# Patient Record
Sex: Male | Born: 1990
Health system: Southern US, Community
[De-identification: ages and names within clinical notes are randomized; demographics above are authoritative.]

## PROBLEM LIST (undated history)

## (undated) ENCOUNTER — Ambulatory Visit: Admission: EM | Discharge status: Against Medical Advice | Payer: Self-pay

## (undated) DIAGNOSIS — J302 Other seasonal allergic rhinitis: Secondary | ICD-10-CM

## (undated) DIAGNOSIS — M419 Scoliosis, unspecified: Secondary | ICD-10-CM

## (undated) DIAGNOSIS — R55 Syncope and collapse: Secondary | ICD-10-CM

## (undated) DIAGNOSIS — K219 Gastro-esophageal reflux disease without esophagitis: Secondary | ICD-10-CM

## (undated) DIAGNOSIS — N201 Calculus of ureter: Secondary | ICD-10-CM

## (undated) DIAGNOSIS — N200 Calculus of kidney: Secondary | ICD-10-CM

## (undated) DIAGNOSIS — T8859XA Other complications of anesthesia, initial encounter: Secondary | ICD-10-CM

## (undated) HISTORY — PX: BACK SURGERY: SHX140

## (undated) HISTORY — PX: TONSILLECTOMY: SUR1361

---
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
## 2010-02-06 ENCOUNTER — Emergency Department: Payer: Self-pay | Admitting: Emergency Medicine

## 2012-06-06 ENCOUNTER — Ambulatory Visit: Payer: Self-pay

## 2012-06-06 LAB — CBC WITH DIFFERENTIAL/PLATELET
Basophil #: 0.1 10*3/uL (ref 0.0–0.1)
Basophil %: 1.1 %
Eosinophil #: 0.4 10*3/uL (ref 0.0–0.7)
Eosinophil %: 4.2 %
Lymphocyte #: 2.4 10*3/uL (ref 1.0–3.6)
Lymphocyte %: 27.1 %
MCH: 29.5 pg (ref 26.0–34.0)
MCHC: 34.2 g/dL (ref 32.0–36.0)
MCV: 86 fL (ref 80–100)
Monocyte #: 0.8 x10 3/mm (ref 0.2–1.0)
Neutrophil %: 58.8 %
Platelet: 230 10*3/uL (ref 150–440)
RDW: 12.4 % (ref 11.5–14.5)
WBC: 8.7 10*3/uL (ref 3.8–10.6)

## 2013-05-27 ENCOUNTER — Emergency Department: Payer: Self-pay | Admitting: Emergency Medicine

## 2013-05-30 ENCOUNTER — Ambulatory Visit: Payer: Self-pay | Admitting: Medical

## 2016-05-19 ENCOUNTER — Ambulatory Visit
Admission: EM | Admit: 2016-05-19 | Discharge: 2016-05-19 | Disposition: A | Discharge status: Home / Self Care | Payer: BLUE CROSS/BLUE SHIELD | Attending: Family Medicine | Admitting: Family Medicine

## 2016-05-19 ENCOUNTER — Encounter: Payer: Self-pay | Admitting: *Deleted

## 2016-05-19 DIAGNOSIS — L089 Local infection of the skin and subcutaneous tissue, unspecified: Secondary | ICD-10-CM

## 2016-05-19 HISTORY — DX: Scoliosis, unspecified: M41.9

## 2016-05-19 MED ORDER — DOXYCYCLINE HYCLATE 100 MG PO CAPS: 100.0000 mg | ORAL_CAPSULE | Freq: Two times a day (BID) | ORAL | 0 refills | Status: AC

## 2016-05-19 MED ORDER — IBUPROFEN 600 MG PO TABS: 600.0000 mg | ORAL_TABLET | Freq: Four times a day (QID) | ORAL | 0 refills | Status: DC | PRN
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
## 2016-05-19 NOTE — ED Triage Notes (Signed)
Patient noticed 2 insect bites 2 days ago located on his right buttock and left side a waist line. Patient reports removing a crawling tick from his left flank.

## 2016-05-19 NOTE — Discharge Instructions (Signed)
Initially antibiotics even if he starts to feel better also try Claritin or Allegra, or Zyrtec to help with the itching and swelling, cool compresses, ibuprofen 600 mg with 1 g of Tylenol 3-4 times a day as needed for pain and bacitracin Topical ointment.

## 2016-06-05 ENCOUNTER — Encounter: Payer: Self-pay | Admitting: Physician Assistant

## 2016-06-05 ENCOUNTER — Emergency Department
Admission: EM | Admit: 2016-06-05 | Discharge: 2016-06-05 | Disposition: A | Discharge status: Home / Self Care | Payer: BLUE CROSS/BLUE SHIELD | Attending: Emergency Medicine | Admitting: Emergency Medicine

## 2016-06-05 DIAGNOSIS — R112 Nausea with vomiting, unspecified: Secondary | ICD-10-CM | POA: Diagnosis present

## 2016-06-05 DIAGNOSIS — K529 Noninfective gastroenteritis and colitis, unspecified: Secondary | ICD-10-CM | POA: Insufficient documentation

## 2016-06-05 MED ORDER — ONDANSETRON 4 MG PO TBDP: 4.0000 mg | ORAL_TABLET | Freq: Four times a day (QID) | ORAL | 0 refills | Status: DC | PRN

## 2016-06-05 MED ORDER — ONDANSETRON 4 MG PO TBDP
4.0000 mg | ORAL_TABLET | Freq: Once | ORAL | Status: AC
  Administered 2016-06-05: 4 mg via ORAL
  Filled 2016-06-05: qty 1

## 2016-06-05 MED ORDER — LOPERAMIDE HCL 2 MG PO CAPS
2.0000 mg | ORAL_CAPSULE | Freq: Once | ORAL | Status: AC
  Administered 2016-06-05: 2 mg via ORAL
  Filled 2016-06-05: qty 1

## 2016-06-05 NOTE — Discharge Instructions (Signed)
Your symptoms appear to be due to a viral infection. Take the nausea medicine as needed. Increase intake of liquids like Gatorade or Powerade. See your provider for continued symptoms. Return as needed.

## 2016-06-05 NOTE — ED Triage Notes (Signed)
Nausea vomiting and diarrhea since 5 am. Denies abd pain. Wife with same symptoms.

## 2016-06-07 ENCOUNTER — Encounter: Payer: Self-pay | Admitting: Physician Assistant
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
## 2017-06-08 ENCOUNTER — Ambulatory Visit
Admission: EM | Admit: 2017-06-08 | Discharge: 2017-06-08 | Disposition: A | Discharge status: Home / Self Care | Payer: BLUE CROSS/BLUE SHIELD | Attending: Family Medicine | Admitting: Family Medicine

## 2017-06-08 ENCOUNTER — Other Ambulatory Visit: Payer: Self-pay

## 2017-06-08 ENCOUNTER — Encounter: Payer: Self-pay | Admitting: Emergency Medicine

## 2017-06-08 DIAGNOSIS — J301 Allergic rhinitis due to pollen: Secondary | ICD-10-CM

## 2017-06-08 HISTORY — DX: Syncope and collapse: R55

## 2017-06-08 HISTORY — DX: Other seasonal allergic rhinitis: J30.2

## 2017-06-08 MED ORDER — FLUTICASONE PROPIONATE 50 MCG/ACT NA SUSP: 2.0000 | Freq: Every day | NASAL | 0 refills | Status: DC

## 2017-06-08 MED ORDER — CETIRIZINE-PSEUDOEPHEDRINE ER 5-120 MG PO TB12: 1.0000 | ORAL_TABLET | Freq: Two times a day (BID) | ORAL | 0 refills | Status: DC

## 2017-06-08 NOTE — Discharge Instructions (Signed)
Recommend using Nettie pot daily.  After using the Nettie pot spray with Flonase.  Use Zyrtec-D for 1 to 2 weeks and then continue with regular Zyrtec thereafter.

## 2017-06-08 NOTE — ED Triage Notes (Signed)
Patient in today c/o nasal congestion and drainage x 2 days. Patient denies fever. Patient taking OTC Zyrtec and Sudafed without relief.
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
## 2018-03-18 ENCOUNTER — Encounter: Payer: Self-pay | Admitting: Emergency Medicine

## 2018-03-18 ENCOUNTER — Emergency Department
Admission: EM | Admit: 2018-03-18 | Discharge: 2018-03-18 | Disposition: A | Discharge status: Home / Self Care | Payer: Self-pay | Attending: Student in an Organized Health Care Education/Training Program | Admitting: Student in an Organized Health Care Education/Training Program

## 2018-03-18 ENCOUNTER — Other Ambulatory Visit: Payer: Self-pay

## 2018-03-18 ENCOUNTER — Emergency Department: Payer: Self-pay

## 2018-03-18 DIAGNOSIS — R69 Illness, unspecified: Secondary | ICD-10-CM

## 2018-03-18 DIAGNOSIS — Z79899 Other long term (current) drug therapy: Secondary | ICD-10-CM | POA: Insufficient documentation

## 2018-03-18 DIAGNOSIS — J111 Influenza due to unidentified influenza virus with other respiratory manifestations: Secondary | ICD-10-CM | POA: Insufficient documentation

## 2018-03-18 MED ORDER — ACETAMINOPHEN 500 MG PO TABS: 500.0000 mg | ORAL_TABLET | Freq: Four times a day (QID) | ORAL | 0 refills | Status: DC | PRN

## 2018-03-18 MED ORDER — IBUPROFEN 600 MG PO TABS: 600.0000 mg | ORAL_TABLET | Freq: Four times a day (QID) | ORAL | 0 refills | Status: DC | PRN

## 2018-03-18 MED ORDER — ONDANSETRON 4 MG PO TBDP
4.0000 mg | ORAL_TABLET | Freq: Once | ORAL | Status: AC
  Administered 2018-03-18: 4 mg via ORAL
  Filled 2018-03-18: qty 1

## 2018-03-18 MED ORDER — IPRATROPIUM-ALBUTEROL 0.5-2.5 (3) MG/3ML IN SOLN
3.0000 mL | Freq: Once | RESPIRATORY_TRACT | Status: AC
  Administered 2018-03-18: 3 mL via RESPIRATORY_TRACT
  Filled 2018-03-18: qty 3

## 2018-03-18 MED ORDER — BENZONATATE 100 MG PO CAPS
100.0000 mg | ORAL_CAPSULE | Freq: Once | ORAL | Status: AC
  Administered 2018-03-18: 100 mg via ORAL
  Filled 2018-03-18: qty 1

## 2018-03-18 MED ORDER — GUAIFENESIN-CODEINE 100-10 MG/5ML PO SYRP: 5.0000 mL | ORAL_SOLUTION | Freq: Three times a day (TID) | ORAL | 0 refills | Status: AC | PRN

## 2018-03-18 MED ORDER — AZITHROMYCIN 250 MG PO TABS: ORAL_TABLET | ORAL | 0 refills | Status: DC

## 2018-03-18 NOTE — ED Triage Notes (Signed)
Cough, chest congestion x 2-3 days.  Reports fevers last week.    AAOx3.  Skin warm and dry. NAD
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
## 2018-03-18 NOTE — ED Notes (Signed)
See triage note  Presents with cough and low grade fever  States he was sick last week with body aches and subjective fever   States cough and fever returned this weekend

## 2018-03-18 NOTE — ED Notes (Signed)
First Nurse Note: Patient to ED with Mother, WC from car.  Complaining of coughing and congestion.  Given mask to wear.

## 2020-02-22 ENCOUNTER — Ambulatory Visit: Admit: 2020-02-22 | Discharge: 2020-02-22 | Discharge status: Absent without leave | Payer: Self-pay

## 2020-02-22 ENCOUNTER — Encounter: Payer: Self-pay | Admitting: Emergency Medicine

## 2020-02-22 ENCOUNTER — Ambulatory Visit
Admission: EM | Admit: 2020-02-22 | Discharge: 2020-02-22 | Disposition: A | Discharge status: Home / Self Care | Payer: HRSA Program | Attending: Physician Assistant | Admitting: Physician Assistant

## 2020-02-22 ENCOUNTER — Other Ambulatory Visit: Payer: Self-pay

## 2020-02-22 DIAGNOSIS — R059 Cough, unspecified: Secondary | ICD-10-CM | POA: Insufficient documentation

## 2020-02-22 DIAGNOSIS — U071 COVID-19: Secondary | ICD-10-CM | POA: Insufficient documentation

## 2020-02-22 DIAGNOSIS — R0981 Nasal congestion: Secondary | ICD-10-CM | POA: Insufficient documentation

## 2020-02-22 DIAGNOSIS — J069 Acute upper respiratory infection, unspecified: Secondary | ICD-10-CM | POA: Diagnosis not present

## 2020-02-22 NOTE — Discharge Instructions (Addendum)

## 2020-02-22 NOTE — ED Triage Notes (Signed)
Patient c/o cough and runny nose that started on Monday.  Patient denies fevers.
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
## 2020-02-23 ENCOUNTER — Encounter: Payer: Self-pay | Admitting: Emergency Medicine

## 2020-02-23 LAB — SARS CORONAVIRUS 2 (TAT 6-24 HRS): SARS Coronavirus 2: POSITIVE — AB

## 2020-03-26 IMAGING — CR DG CHEST 2V
1 series · 2 of 2 positions shown · non-contrast
Comparison: None.

CLINICAL DATA: Cough and fever

EXAM:
CHEST - 2 VIEW

[Series 1: dg chest 2 view · 0.14mm/px · 2 of 2 slices shown]
[im 1/2]
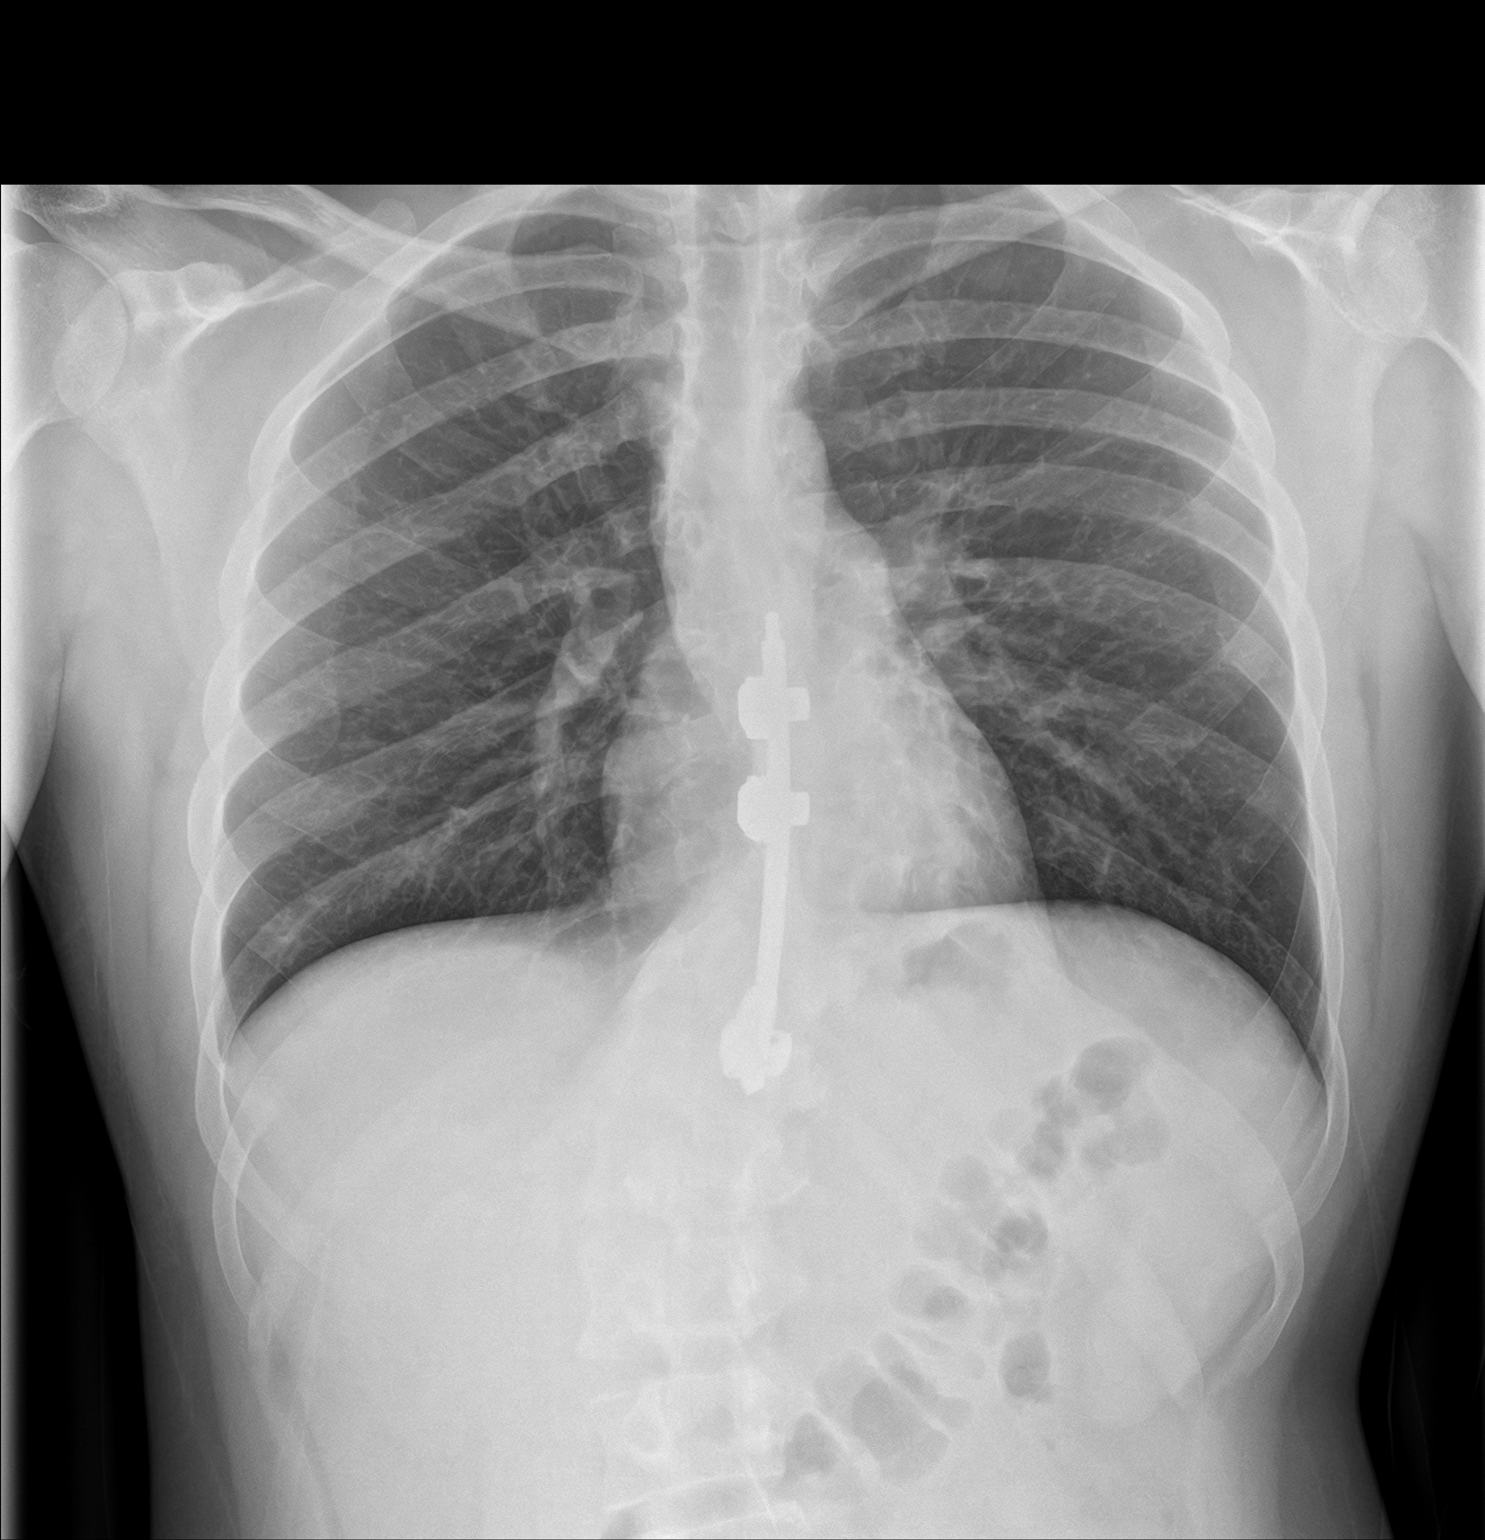
[im 2/2]
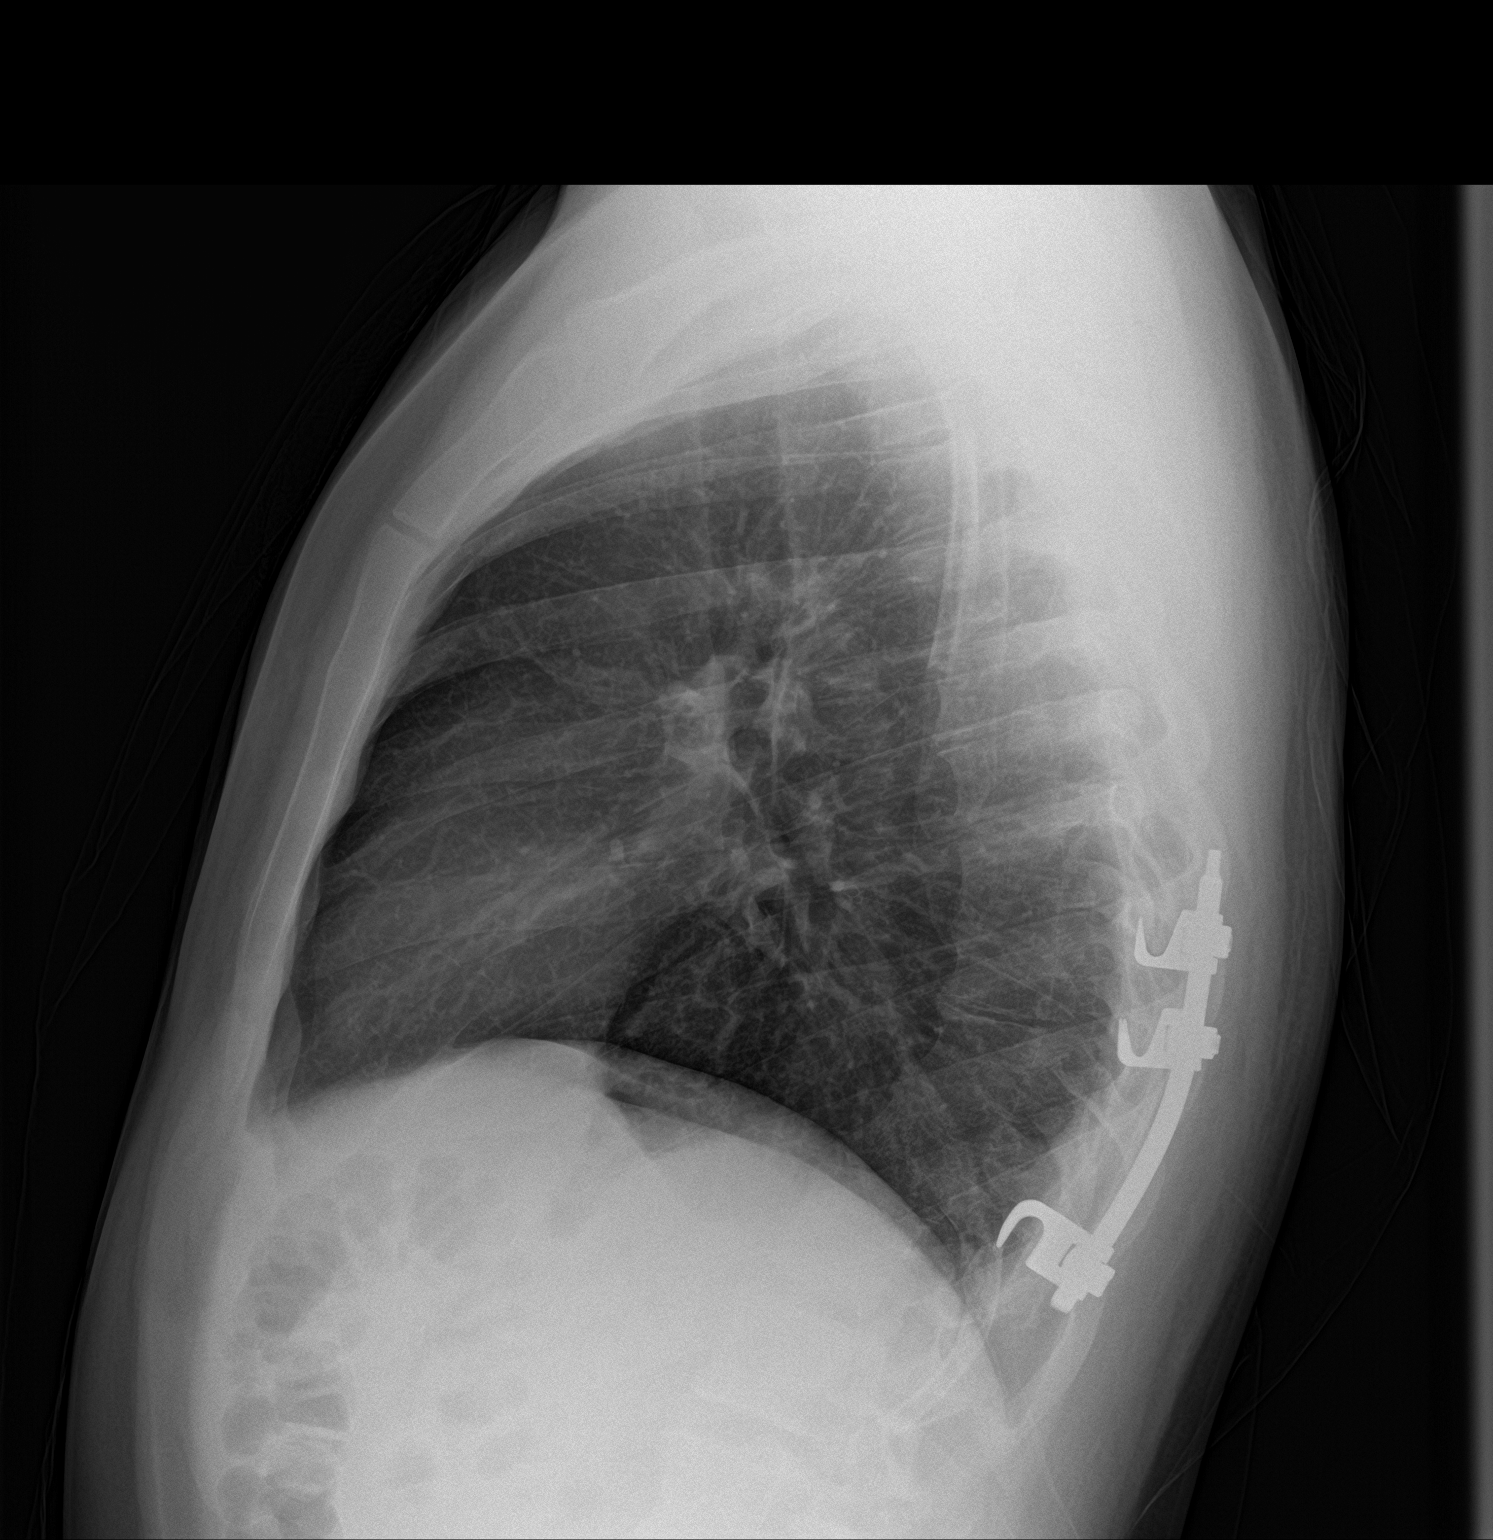

[2 of 2 positions shown; findings below may reference images not displayed]

FINDINGS: Lungs are clear. Heart size and pulmonary vascularity are normal. No
adenopathy. There is postoperative change in the lower thoracic
region with lower thoracic levoscoliosis.
IMPRESSION: No edema or consolidation.

## 2024-01-17 ENCOUNTER — Other Ambulatory Visit: Payer: Self-pay

## 2024-01-17 ENCOUNTER — Emergency Department
Admission: EM | Admit: 2024-01-17 | Discharge: 2024-01-17 | Disposition: A | Discharge status: Home / Self Care | Payer: Self-pay | Attending: Emergency Medicine | Admitting: Emergency Medicine

## 2024-01-17 ENCOUNTER — Encounter: Payer: Self-pay | Admitting: Emergency Medicine

## 2024-01-17 ENCOUNTER — Emergency Department: Payer: Self-pay

## 2024-01-17 DIAGNOSIS — N200 Calculus of kidney: Secondary | ICD-10-CM

## 2024-01-17 DIAGNOSIS — D72829 Elevated white blood cell count, unspecified: Secondary | ICD-10-CM | POA: Insufficient documentation

## 2024-01-17 DIAGNOSIS — N132 Hydronephrosis with renal and ureteral calculous obstruction: Secondary | ICD-10-CM | POA: Insufficient documentation

## 2024-01-17 DIAGNOSIS — N201 Calculus of ureter: Secondary | ICD-10-CM

## 2024-01-17 LAB — COMPREHENSIVE METABOLIC PANEL WITH GFR
ALT: 41 U/L (ref 0–44)
AST: 31 U/L (ref 15–41)
Albumin: 4.1 g/dL (ref 3.5–5.0)
Alkaline Phosphatase: 59 U/L (ref 38–126)
Anion gap: 10 (ref 5–15)
BUN: 20 mg/dL (ref 6–20)
CO2: 26 mmol/L (ref 22–32)
Calcium: 9.3 mg/dL (ref 8.9–10.3)
Chloride: 96 mmol/L — ABNORMAL LOW (ref 98–111)
Creatinine, Ser: 1.36 mg/dL — ABNORMAL HIGH (ref 0.61–1.24)
GFR, Estimated: >60 mL/min (ref >60)
Glucose, Bld: 103 mg/dL — ABNORMAL HIGH (ref 70–99)
Potassium: 4.6 mmol/L (ref 3.5–5.1)
Sodium: 133 mmol/L — ABNORMAL LOW (ref 135–145)
Total Bilirubin: 0.4 mg/dL (ref 0.0–1.2)
Total Protein: 7.3 g/dL (ref 6.5–8.1)

## 2024-01-17 LAB — CBC
HCT: 44.1 % (ref 39.0–52.0)
Hemoglobin: 14.9 g/dL (ref 13.0–17.0)
MCH: 30.2 pg (ref 26.0–34.0)
MCHC: 33.8 g/dL (ref 30.0–36.0)
MCV: 89.3 fL (ref 80.0–100.0)
Platelets: 403 K/uL — ABNORMAL HIGH (ref 150–400)
RBC: 4.94 MIL/uL (ref 4.22–5.81)
RDW: 11.9 % (ref 11.5–15.5)
WBC: 18.9 K/uL — ABNORMAL HIGH (ref 4.0–10.5)
nRBC: 0 % (ref 0.0–0.2)

## 2024-01-17 LAB — URINALYSIS, ROUTINE W REFLEX MICROSCOPIC
Bacteria, UA: NONE SEEN
Bilirubin Urine: NEGATIVE
Glucose, UA: NEGATIVE mg/dL
Ketones, ur: NEGATIVE mg/dL
Leukocytes,Ua: NEGATIVE
Nitrite: NEGATIVE
Protein, ur: NEGATIVE mg/dL
Specific Gravity, Urine: 1.021 (ref 1.005–1.030)
Squamous Epithelial / HPF: 0 /HPF (ref 0–5)
pH: 5 (ref 5.0–8.0)

## 2024-01-17 MED ORDER — KETOROLAC TROMETHAMINE 30 MG/ML IJ SOLN
30.0000 mg | Freq: Once | INTRAMUSCULAR | Status: AC
  Administered 2024-01-17: 30 mg via INTRAVENOUS
  Filled 2024-01-17: qty 1

## 2024-01-17 MED ORDER — ONDANSETRON HCL 4 MG/2ML IJ SOLN
4.0000 mg | Freq: Once | INTRAMUSCULAR | Status: AC
  Administered 2024-01-17: 4 mg via INTRAVENOUS
  Filled 2024-01-17: qty 2

## 2024-01-17 MED ORDER — ACETAMINOPHEN 500 MG PO TABS: 1000.0000 mg | ORAL_TABLET | Freq: Three times a day (TID) | ORAL | 0 refills | Status: DC | PRN

## 2024-01-17 MED ORDER — ONDANSETRON 4 MG PO TBDP: 4.0000 mg | ORAL_TABLET | Freq: Three times a day (TID) | ORAL | 0 refills | Status: AC | PRN

## 2024-01-17 MED ORDER — NAPROXEN 500 MG PO TBEC: 500.0000 mg | DELAYED_RELEASE_TABLET | Freq: Two times a day (BID) | ORAL | 0 refills | Status: AC

## 2024-01-17 MED ORDER — OXYCODONE HCL 5 MG PO CAPS: 5.0000 mg | ORAL_CAPSULE | Freq: Four times a day (QID) | ORAL | 0 refills | Status: AC | PRN

## 2024-01-17 MED ORDER — SODIUM CHLORIDE 0.9 % IV BOLUS
1000.0000 mL | Freq: Once | INTRAVENOUS | Status: AC
  Administered 2024-01-17: 1000 mL via INTRAVENOUS

## 2024-01-17 MED ORDER — TAMSULOSIN HCL 0.4 MG PO CAPS
0.4000 mg | ORAL_CAPSULE | Freq: Once | ORAL | Status: AC
  Administered 2024-01-17: 0.4 mg via ORAL
  Filled 2024-01-17: qty 1

## 2024-01-17 MED ORDER — TAMSULOSIN HCL 0.4 MG PO CAPS: 0.4000 mg | ORAL_CAPSULE | Freq: Every day | ORAL | 0 refills | Status: DC

## 2024-01-17 NOTE — ED Provider Notes (Signed)
 [facility] Provider Note    Event Date/Time   First MD Initiated Contact with Patient 01/17/24 1046     (approximate)   History   Flank Pain   HPI  Luke Campbell is a 33 y.o. male  with a past medical history of scoliosis, vasovagal syncope, seasonal allergies presents to the emergency department with left sided flank pain that radiates into his left lower quadrant and left inguinal and testicle region that suddenly started today with nausea and vomiting.  States he had a nephrolithiasis 2 weeks ago after being seen at hospital in [place] for the pain, and is unsure if he has passed the stone. Reports one sexual partner, his wife, whom he has not had intercourse with in a while. Denies dysuria, increased urinary frequency, hematuria, abnormal penile discharge, dysuria, chest pain, SOB. Reports he has felt a lump in his left testicle  the past few weeks since his nephrolithiasis diagnosis 2 weeks ago. Last BM this morning.    Physical Exam   Triage Vital Signs: ED Triage Vitals  Encounter Vitals Group     BP 01/17/24 1020 (!) 151/103     Girls Systolic BP Percentile --      Girls Diastolic BP Percentile --      Boys Systolic BP Percentile --      Boys Diastolic BP Percentile --      Pulse Rate 01/17/24 1020 (!) 103     Resp 01/17/24 1020 18     Temp 01/17/24 1020 97.6 F (36.4 C)     Temp Source 01/17/24 1020 Oral     SpO2 01/17/24 1020 100 %     Weight 01/17/24 1018 160 lb (72.6 kg)     Height 01/17/24 1018 5' 10 (1.778 m)     Head Circumference --      Peak Flow --      Pain Score --      Pain Loc --      Pain Education --      Exclude from Growth Chart --     Most recent vital signs: Vitals:   01/17/24 1339 01/17/24 1508  BP: (!) 129/98 120/80  Pulse: 89 75  Resp: 16 16  Temp:  98 F (36.7 C)  SpO2: 100% 98%    General: Awake, in no acute distress. Appears stated age. Head: Normocephalic, atraumatic. Neck:  Supple. CV: Good peripheral perfusion. RRR, 89 bpm. Respiratory:Normal respiratory effort.  No respiratory distress. CTAB. GI: Soft, non-distended, TTP in LLQ, left flank, left inguinal regions.  Skin:Warm, dry, intact. No rashes, lesions, or ecchymosis. No cyanosis or pallor. Chaperone RN [employer] present for exam.  ED Results / Procedures / Treatments   Labs (all labs ordered are listed, but only abnormal results are displayed) Labs Reviewed  CBC - Abnormal; Notable for the following components:      Result Value   WBC 18.9 (*)    Platelets 403 (*)    All other components within normal limits  COMPREHENSIVE METABOLIC PANEL WITH GFR - Abnormal; Notable for the following components:   Sodium 133 (*)    Chloride 96 (*)    Glucose, Bld 103 (*)    Creatinine, Ser 1.36 (*)    All other components within normal limits  URINALYSIS, ROUTINE W REFLEX MICROSCOPIC - Abnormal; Notable for the following components:   Color, Urine YELLOW (*)    APPearance CLEAR (*)    Hgb urine dipstick MODERATE (*)  All other components within normal limits     EKG     RADIOLOGY CT renal stone study  Scrotal US  w/ Doppler  Results below in MDM section  PROCEDURES:  Critical Care performed: No   Procedures   MEDICATIONS ORDERED IN ED: Medications  sodium chloride  0.9 % bolus 1,000 mL (0 mLs Intravenous Stopped 01/17/24 1505)  ondansetron  (ZOFRAN ) injection 4 mg (4 mg Intravenous Given 01/17/24 1145)  ketorolac  (TORADOL ) 30 MG/ML injection 30 mg (30 mg Intravenous Given 01/17/24 1146)  tamsulosin  (FLOMAX ) capsule 0.4 mg (0.4 mg Oral Given 01/17/24 1146)     IMPRESSION / MDM / ASSESSMENT AND PLAN / ED COURSE  I reviewed the triage vital signs and the nursing notes.                              Differential diagnosis includes, but is not limited to, nephrolithiasis, urolithiasis, UTI, epididymitis, orchitis  Patient's presentation is most consistent with acute complicated illness /  injury requiring diagnostic workup.   Clinical Course as of 01/17/24 1523  Thu Jan 17, 2024  1404 Late entry.  In short, patient here with left flank pain radiating to his left lower quadrant, inguinal and testicle region.  CBC with elevated white blood cell count 18.9, elevated platelets of 403.  CMP with sodium 133, elevated Cr 1.36, normal BUN 20. UA w/ moderate Hgb, RBCs and 0-5 WBCs; no bacteria, ketones, nitrites or leukocytes.  [SD]  1406 Testicular exam with no acute findings. Had no abnormal discharge on exam or reported and is sexually active with one partner, his wife.  [SD]  1408 Scrotal US  w/ Doppler IMPRESSION: No acute findings.  [SD]  1409 CT Renal Stone Study IMPRESSION: 1. Bilateral nephrolithiasis. 2. Moderate left hydronephrosis with perinephric stranding due to two proximal left ureteral calculi, largest 7 mm, unchanged from prior exam.  [SD]  1430 Consulting urology. Patient's pain is controlled at this time. [SD]  1447 Patient is afebrile, nontoxic appearing. Pain improved on re-examination. Discussed w/ Dr. Twylla , urology specialist. He states patient is suitable for outpatient follow up. Will give tamsulosin , oxycodone , naproxen , tylenol , and zofran  prescriptions and have him follow up with them. PDMP reviewed prior to prescribing oxycodone . Encouraged increased fluids as well. Will see if we have a strainer here. [SD]    Clinical Course User Index [SD] Sheron Salm, PA-C    The patient may return to the emergency department for any new, worsening, or concerning symptoms. Patient was given the opportunity to ask questions; all questions were answered. Emergency department return precautions were discussed with the patient.  Patient is in agreement to the treatment plan.  Patient is stable for discharge.   FINAL CLINICAL IMPRESSION(S) / ED DIAGNOSES   Final diagnoses:  Bilateral nephrolithiasis  Ureteral calculi     Rx / DC Orders   ED Discharge Orders           Ordered    tamsulosin  (FLOMAX ) 0.4 MG CAPS capsule  Daily after supper        01/17/24 1458    oxycodone  (OXY-IR) 5 MG capsule  Every 6 hours PRN        01/17/24 1458    acetaminophen  (TYLENOL ) 500 MG tablet  Every 8 hours PRN        01/17/24 1458    naproxen  (EC NAPROSYN ) 500 MG EC tablet  2 times daily with meals  01/17/24 1458    ondansetron  (ZOFRAN -ODT) 4 MG disintegrating tablet  Every 8 hours PRN        01/17/24 1458    Ambulatory Referral to Primary Care (Establish Care)        01/17/24 1458             Note:  This document was prepared using Dragon voice recognition software and may include unintentional dictation errors.     Sheron Salm, PA-C 01/17/24 1523    Bradler, Evan K, MD 01/18/24 [phone]

## 2024-01-17 NOTE — ED Triage Notes (Signed)
 Pt via POV from home. Pt c/o L flank pain that radiates to the LLQ, report was seen 2 weeks ago for kidney stone unknown if he passed that stone or not. Pt c/o sudden onset of pain this AM around 0300. Reports some NV. Denies any urinary symptoms. Pt is A&Ox4 and NAD, ambulatory to triage.

## 2024-01-17 NOTE — Discharge Instructions (Addendum)
 You have been seen in the Emergency Department (ED) today for pain caused by kidney stones.  As we have discussed, please drink plenty of fluids.  Please make a follow up appointment with the physician(s) listed elsewhere in this documentation (dr. Twylla with urology).  You may take pain medication as needed but ONLY as prescribed.  Please also take your prescribed Flomax daily.    Please see your doctor as soon as possible as stones may take 1-3 weeks to pass and you may require additional care or medications. I have made a referral to primary care for you.  Look through the resources listed here and given their offices a call to establish care.  Do not drink alcohol, drive or participate in any other potentially dangerous activities while taking opiate pain medication as it may make you sleepy. Do not take this medication with any other sedating medications, either prescription or over-the-counter. If you were prescribed Percocet or Vicodin, do not take these with acetaminophen  (Tylenol ) as it is already contained within these medications.   Take oxycodone as needed for severe pain.  This medication is an opiate (or narcotic) pain medication and can be habit forming.  Use it as little as possible to achieve adequate pain control.  Do not use or use it with extreme caution if you have a history of opiate abuse or dependence.  If you are on a pain contract with your primary care doctor or a pain specialist, be sure to let them know you were prescribed this medication today from the [facility].  This medication is intended for your use only - do not give any to anyone else and keep it in a secure place where nobody else, especially children, have access to it.  It will also cause or worsen constipation, so you may want to consider taking an over-the-counter stool softener while you are taking this medication.  Return to the Emergency Department (ED) or call your doctor if you  have any worsening pain, fever, painful urination, are unable to urinate, or develop other symptoms that concern you.

## 2024-01-22 ENCOUNTER — Ambulatory Visit: Payer: Self-pay | Admitting: Urology

## 2024-01-22 ENCOUNTER — Ambulatory Visit
Admission: RE | Admit: 2024-01-22 | Discharge: 2024-01-22 | Disposition: A | Payer: Self-pay | Source: Ambulatory Visit | Attending: Urology | Admitting: Urology

## 2024-01-22 ENCOUNTER — Encounter: Payer: Self-pay | Admitting: Urology

## 2024-01-22 VITALS — BP 134/94 | HR 106 | Ht 70.0 in | Wt 160.0 lb

## 2024-01-22 DIAGNOSIS — N200 Calculus of kidney: Secondary | ICD-10-CM

## 2024-01-22 DIAGNOSIS — N132 Hydronephrosis with renal and ureteral calculous obstruction: Secondary | ICD-10-CM

## 2024-01-22 DIAGNOSIS — N23 Unspecified renal colic: Secondary | ICD-10-CM

## 2024-01-22 DIAGNOSIS — N201 Calculus of ureter: Secondary | ICD-10-CM

## 2024-01-22 MED ORDER — TAMSULOSIN HCL 0.4 MG PO CAPS: 0.4000 mg | ORAL_CAPSULE | Freq: Every day | ORAL | 0 refills | Status: AC

## 2024-01-22 MED ORDER — TAMSULOSIN HCL 0.4 MG PO CAPS: 0.4000 mg | ORAL_CAPSULE | Freq: Every day | ORAL | 0 refills | Status: DC
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
## 2024-01-23 ENCOUNTER — Encounter: Payer: Self-pay | Admitting: Urology

## 2024-01-23 LAB — URINALYSIS, COMPLETE
Bilirubin, UA: NEGATIVE
Glucose, UA: NEGATIVE
Ketones, UA: NEGATIVE
Leukocytes,UA: NEGATIVE
Nitrite, UA: NEGATIVE
RBC, UA: NEGATIVE
Specific Gravity, UA: 1.025 (ref 1.005–1.030)
Urobilinogen, Ur: 0.2 mg/dL (ref 0.2–1.0)
pH, UA: 6 (ref 5.0–7.5)

## 2024-01-23 LAB — MICROSCOPIC EXAMINATION

## 2024-01-28 ENCOUNTER — Other Ambulatory Visit: Payer: Self-pay | Admitting: Urology

## 2024-01-28 ENCOUNTER — Other Ambulatory Visit: Payer: Self-pay

## 2024-01-28 ENCOUNTER — Telehealth: Payer: Self-pay

## 2024-01-28 DIAGNOSIS — N201 Calculus of ureter: Secondary | ICD-10-CM

## 2024-01-28 NOTE — Progress Notes (Signed)
 Surgical Physician Order Form  [facility]  Dr. Glendia Barba, MD  * Scheduling expectation : Next Available  *Length of Case: 60 min  *Clearance needed: no  *Anticoagulation Instructions: N/A  *Aspirin Instructions: N/A  *Post-op visit Date/Instructions:  1 week cysto stent removal  *Diagnosis: Left Ureteral Stone  *Procedure: left Ureteroscopy w/laser lithotripsy & stent placement (47643)   Additional orders: N/A  -Admit type: OUTpatient  -Anesthesia: General  -VTE Prophylaxis Standing Order SCD's       Other:   -Standing Lab Orders Per Anesthesia    Lab other: UA&Urine Culture  -Standing Test orders EKG/Chest x-ray per Anesthesia       Test other:   - Medications:  Ancef 2gm IV  -Other orders:  N/A

## 2024-01-28 NOTE — Addendum Note (Signed)
 Addended by: RUTHER SETTER A on: 01/28/2024 01:05 PM   Modules accepted: Orders

## 2024-01-28 NOTE — Progress Notes (Signed)
° °  [facility] Posting Form  Surgery Date: Date: 02/15/2024  Surgeon: Dr. Glendia Barba, MD  Inpt ( No  )   Outpt (Yes)   Obs ( No  )   Diagnosis: N20.1 Left Ureteral Stone  -CPT: [id number]  Surgery: Left Ureteroscopy with Laser Lithotripsy with Stent Placement  Stop Anticoagulations: No  Cardiac/Medical/Pulmonary Clearance needed: no  *Orders entered into EPIC  Date: 01/28/2024   *Case booked in [place]  Date: 01/28/2024  *Notified pt of Surgery: Date: 01/28/2024  PRE-OP UA & CX: yes, will obtain in clinic on 01/31/2024  *Placed into Prior Authorization Work [place] Date: 01/28/2024  Assistant/laser/rep:No

## 2024-01-28 NOTE — Addendum Note (Signed)
 Addended by: RUTHER SETTER A on: 01/28/2024 01:08 PM   Modules accepted: Orders

## 2024-01-28 NOTE — Telephone Encounter (Signed)
 Per Dr. Twylla, Patient is to be scheduled for Left Ureteroscopy with Laser Lithotripsy with Stent Placement  Mr. Luke Campbell was contacted and possible surgical dates were discussed, Friday January 2nd, 2026 was agreed upon for surgery.   Patient was instructed that Dr. Twylla will require them to provide a pre-op UA & CX prior to surgery. This was ordered and scheduled drop off appointment was made for 01/31/2024.    Patient was directed to call [phone] between 1-3pm the day before surgery to find out surgical arrival time.  Instructions were given not to eat or drink from midnight on the night before surgery and have a driver for the day of surgery. On the surgery day patient was instructed to enter through the Medical Mall entrance of [facility] report the Same Day Surgery desk.   Pre-Admit Testing will be in contact via phone to set up an interview with the anesthesia team to review your history and medications prior to surgery.   Reminder of this information was sent via MyChart to the patient.

## 2024-01-31 ENCOUNTER — Other Ambulatory Visit: Payer: Self-pay

## 2024-01-31 DIAGNOSIS — N201 Calculus of ureter: Secondary | ICD-10-CM

## 2024-01-31 LAB — URINALYSIS, COMPLETE
Bilirubin, UA: NEGATIVE
Glucose, UA: NEGATIVE
Ketones, UA: NEGATIVE
Leukocytes,UA: NEGATIVE
Nitrite, UA: NEGATIVE
Protein,UA: NEGATIVE
RBC, UA: NEGATIVE
Specific Gravity, UA: 1.02 (ref 1.005–1.030)
Urobilinogen, Ur: 0.2 mg/dL (ref 0.2–1.0)
pH, UA: 6 (ref 5.0–7.5)

## 2024-01-31 LAB — MICROSCOPIC EXAMINATION

## 2024-02-05 ENCOUNTER — Other Ambulatory Visit: Payer: Self-pay

## 2024-02-05 ENCOUNTER — Encounter
Admission: RE | Admit: 2024-02-05 | Discharge: 2024-02-05 | Disposition: A | Discharge status: Home / Self Care | Payer: Self-pay | Source: Ambulatory Visit | Attending: Urology | Admitting: Urology

## 2024-02-05 HISTORY — DX: Other complications of anesthesia, initial encounter: T88.59XA

## 2024-02-05 HISTORY — DX: Calculus of kidney: N20.0

## 2024-02-05 HISTORY — DX: Calculus of ureter: N20.1

## 2024-02-05 HISTORY — DX: Gastro-esophageal reflux disease without esophagitis: K21.9

## 2024-02-05 NOTE — Patient Instructions (Signed)
 Your procedure is scheduled on:02-15-23 Friday Report to the Registration Desk on the 1st floor of the Medical Mall.Then proceed to the 2nd floor Surgery Desk To find out your arrival time, please call [phone] between 1PM - 3PM on:02-13-24 Wednesday If your arrival time is 6:00 am, do not arrive before that time as the Medical Mall entrance doors do not open until 6:00 am.  REMEMBER: Instructions that are not followed completely may result in serious medical risk, up to and including death; or upon the discretion of your surgeon and anesthesiologist your surgery may need to be rescheduled.  Do not eat food OR drink liquids after midnight the night before surgery.  No gum chewing or hard candies.  One week prior to surgery: Last dose will be on 12-25 Thursday Stop Anti-inflammatories (NSAIDS) such as Advil , Aleve , Ibuprofen , Motrin , Naproxen , Naprosyn  and Aspirin based products such as Excedrin, Goody's Powder, BC Powder. Stop ANY OVER THE COUNTER supplements until after surgery.  You may however, continue to take Tylenol /Oxycodone  if needed for pain up until the day of surgery.  Continue taking all of your other prescription medications up until the day of surgery.  ON THE DAY OF SURGERY ONLY TAKE THESE MEDICATIONS WITH SIPS OF WATER: -lansoprazole (PREVACID)  -You may take oxyCODONE  (OXY IR/ROXICODONE ) for pain if needed  No Alcohol for 24 hours before or after surgery.  No Smoking including e-cigarettes for 24 hours before surgery.  No chewable tobacco products for at least 6 hours before surgery.  No nicotine patches on the day of surgery.  Do not use any recreational drugs for at least a week (preferably 2 weeks) before your surgery.  Please be advised that the combination of cocaine and anesthesia may have negative outcomes, up to and including death. If you test positive for cocaine, your surgery will be cancelled.  On the morning of surgery brush your teeth with  toothpaste and water, you may rinse your mouth with mouthwash if you wish. Do not swallow any toothpaste or mouthwash.  Do not wear jewelry, make-up, hairpins, clips or nail polish.  For welded (permanent) jewelry: bracelets, anklets, waist bands, etc.  Please have this removed prior to surgery.  If it is not removed, there is a chance that hospital personnel will need to cut it off on the day of surgery.  Do not wear lotions, powders, or perfumes.   Do not shave body hair from the neck down 48 hours before surgery.  Contact lenses, hearing aids and dentures may not be worn into surgery.  Do not bring valuables to the hospital. [facility] is not responsible for any missing/lost belongings or valuables.   Notify your doctor if there is any change in your medical condition (cold, fever, infection).  Wear comfortable clothing (specific to your surgery type) to the hospital.  After surgery, you can help prevent lung complications by doing breathing exercises.  Take deep breaths and cough every 1-2 hours. Your doctor may order a device called an Incentive Spirometer to help you take deep breaths. When coughing or sneezing, hold a pillow firmly against your incision with both hands. This is called splinting. Doing this helps protect your incision. It also decreases belly discomfort.  If you are being admitted to the hospital overnight, leave your suitcase in the car. After surgery it may be brought to your room.  In case of increased patient census, it may be necessary for you, the patient, to continue your postoperative care in the Same  Day Surgery department.  If you are being discharged the day of surgery, you will not be allowed to drive home. You will need a responsible individual to drive you home and stay with you for 24 hours after surgery.   If you are taking public transportation, you will need to have a responsible individual with you.  Please call the Pre-admissions Testing  Dept. at [phone] if you have any questions about these instructions.  Surgery Visitation Policy:  Patients having surgery or a procedure may have two visitors.  Children under the age of 74 must have an adult with them who is not the patient.   Merchandiser, Retail to address health-related social needs:  [email or web address]

## 2024-02-05 NOTE — Progress Notes (Signed)
 Completed anesthesia interview for upcoming kidney stone surgery with Dr Twylla. Pt with elevated wbc of 18.9 when he went to ED on 12-4.Discussed with Dorise Pereyra NP. Pereyra NP wanted me to ask pt about any difficulty urinating or fevers due to elevated wbc. Pt denies any of these symptoms

## 2024-02-06 LAB — CULTURE, URINE COMPREHENSIVE

## 2024-02-08 ENCOUNTER — Ambulatory Visit: Payer: Self-pay | Admitting: Urology

## 2024-02-08 ENCOUNTER — Other Ambulatory Visit: Payer: Self-pay | Admitting: Urology

## 2024-02-08 MED ORDER — SULFAMETHOXAZOLE-TRIMETHOPRIM 800-160 MG PO TABS: 1.0000 | ORAL_TABLET | Freq: Two times a day (BID) | ORAL | 0 refills | Status: AC
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
## 2024-02-11 NOTE — Telephone Encounter (Addendum)
 Spoke with pt. All questions answered. Patient started medication on 02/08/24.

## 2024-02-14 MED ORDER — CEFAZOLIN SODIUM-DEXTROSE 2-4 GM/100ML-% IV SOLN
2.0000 g | INTRAVENOUS | Status: AC
  Administered 2024-02-15: 2 g via INTRAVENOUS

## 2024-02-14 MED ORDER — CHLORHEXIDINE GLUCONATE 0.12 % MT SOLN
15.0000 mL | Freq: Once | OROMUCOSAL | Status: AC
  Administered 2024-02-15: 15 mL via OROMUCOSAL

## 2024-02-14 MED ORDER — GABAPENTIN 300 MG PO CAPS
300.0000 mg | ORAL_CAPSULE | Freq: Once | ORAL | Status: AC
  Administered 2024-02-15: 300 mg via ORAL

## 2024-02-14 MED ORDER — ORAL CARE MOUTH RINSE: 15.0000 mL | Freq: Once | OROMUCOSAL | Status: AC

## 2024-02-14 MED ORDER — CELECOXIB 200 MG PO CAPS
200.0000 mg | ORAL_CAPSULE | Freq: Once | ORAL | Status: AC
  Administered 2024-02-15: 200 mg via ORAL

## 2024-02-14 MED ORDER — LACTATED RINGERS IV SOLN: INTRAVENOUS | Status: DC

## 2024-02-14 MED ORDER — ACETAMINOPHEN 500 MG PO TABS: 1000.0000 mg | ORAL_TABLET | Freq: Once | ORAL | Status: DC

## 2024-02-15 ENCOUNTER — Ambulatory Visit
Admission: RE | Admit: 2024-02-15 | Discharge: 2024-02-15 | Disposition: A | Discharge status: Home / Self Care | Payer: Self-pay | Attending: Urology | Admitting: Urology

## 2024-02-15 ENCOUNTER — Encounter: Payer: Self-pay | Admitting: Urology

## 2024-02-15 ENCOUNTER — Ambulatory Visit: Payer: Self-pay | Admitting: Anesthesiology

## 2024-02-15 ENCOUNTER — Ambulatory Visit: Payer: Self-pay | Admitting: Urgent Care

## 2024-02-15 ENCOUNTER — Other Ambulatory Visit: Payer: Self-pay

## 2024-02-15 ENCOUNTER — Ambulatory Visit: Payer: Self-pay

## 2024-02-15 ENCOUNTER — Encounter
Admission: RE | Disposition: A | Discharge status: Home / Self Care | Payer: Self-pay | Source: Home / Self Care | Attending: Urology

## 2024-02-15 DIAGNOSIS — N132 Hydronephrosis with renal and ureteral calculous obstruction: Secondary | ICD-10-CM | POA: Insufficient documentation

## 2024-02-15 DIAGNOSIS — N201 Calculus of ureter: Secondary | ICD-10-CM

## 2024-02-15 DIAGNOSIS — N202 Calculus of kidney with calculus of ureter: Secondary | ICD-10-CM

## 2024-02-15 HISTORY — PX: CYSTOSCOPY/URETEROSCOPY/HOLMIUM LASER/STENT PLACEMENT: SHX6546

## 2024-02-15 SURGERY — CYSTOSCOPY/URETEROSCOPY/HOLMIUM LASER/STENT PLACEMENT
Anesthesia: General | Site: Ureter | Laterality: Left

## 2024-02-15 MED ORDER — LIDOCAINE HCL (CARDIAC) PF 100 MG/5ML IV SOSY
PREFILLED_SYRINGE | INTRAVENOUS | Status: DC | PRN
  Administered 2024-02-15: 100 mg via INTRAVENOUS

## 2024-02-15 MED ORDER — OXYCODONE HCL 5 MG PO TABS
ORAL_TABLET | ORAL | Status: AC
  Filled 2024-02-15: qty 1

## 2024-02-15 MED ORDER — HYDROCODONE-ACETAMINOPHEN 5-325 MG PO TABS: 1.0000 | ORAL_TABLET | Freq: Four times a day (QID) | ORAL | 0 refills | Status: AC | PRN

## 2024-02-15 MED ORDER — CHLORHEXIDINE GLUCONATE 0.12 % MT SOLN
OROMUCOSAL | Status: AC
  Filled 2024-02-15: qty 15

## 2024-02-15 MED ORDER — FENTANYL CITRATE (PF) 100 MCG/2ML IJ SOLN
INTRAMUSCULAR | Status: AC
  Filled 2024-02-15: qty 2

## 2024-02-15 MED ORDER — DEXAMETHASONE SOD PHOSPHATE PF 10 MG/ML IJ SOLN
INTRAMUSCULAR | Status: DC | PRN
  Administered 2024-02-15: 10 mg via INTRAVENOUS

## 2024-02-15 MED ORDER — PROPOFOL 10 MG/ML IV BOLUS
INTRAVENOUS | Status: AC
  Filled 2024-02-15: qty 40

## 2024-02-15 MED ORDER — GABAPENTIN 300 MG PO CAPS
ORAL_CAPSULE | ORAL | Status: AC
  Filled 2024-02-15: qty 1

## 2024-02-15 MED ORDER — OXYCODONE HCL 5 MG PO TABS
5.0000 mg | ORAL_TABLET | Freq: Once | ORAL | Status: AC | PRN
  Administered 2024-02-15: 5 mg via ORAL

## 2024-02-15 MED ORDER — MIDAZOLAM HCL (PF) 2 MG/2ML IJ SOLN
INTRAMUSCULAR | Status: DC | PRN
  Administered 2024-02-15: 2 mg via INTRAVENOUS

## 2024-02-15 MED ORDER — OXYCODONE HCL 5 MG/5ML PO SOLN: 5.0000 mg | Freq: Once | ORAL | Status: AC | PRN

## 2024-02-15 MED ORDER — MIDAZOLAM HCL 2 MG/2ML IJ SOLN
INTRAMUSCULAR | Status: AC
  Filled 2024-02-15: qty 2

## 2024-02-15 MED ORDER — PROPOFOL 10 MG/ML IV BOLUS
INTRAVENOUS | Status: DC | PRN
  Administered 2024-02-15: 200 mg via INTRAVENOUS

## 2024-02-15 MED ORDER — EPHEDRINE 5 MG/ML INJ
INTRAVENOUS | Status: AC
  Filled 2024-02-15: qty 5

## 2024-02-15 MED ORDER — FENTANYL CITRATE (PF) 100 MCG/2ML IJ SOLN
25.0000 ug | INTRAMUSCULAR | Status: DC | PRN
  Administered 2024-02-15 (×2): 25 ug via INTRAVENOUS
  Administered 2024-02-15: 50 ug via INTRAVENOUS

## 2024-02-15 MED ORDER — CEFAZOLIN SODIUM-DEXTROSE 2-4 GM/100ML-% IV SOLN
INTRAVENOUS | Status: AC
  Filled 2024-02-15: qty 100

## 2024-02-15 MED ORDER — GLYCOPYRROLATE 0.2 MG/ML IJ SOLN
INTRAMUSCULAR | Status: AC
  Filled 2024-02-15: qty 1

## 2024-02-15 MED ORDER — DROPERIDOL 2.5 MG/ML IJ SOLN: 0.6250 mg | Freq: Once | INTRAMUSCULAR | Status: DC | PRN

## 2024-02-15 MED ORDER — ONDANSETRON HCL 4 MG/2ML IJ SOLN
INTRAMUSCULAR | Status: AC
  Filled 2024-02-15: qty 2

## 2024-02-15 MED ORDER — OXYBUTYNIN CHLORIDE 5 MG PO TABS: ORAL_TABLET | ORAL | 0 refills | Status: AC

## 2024-02-15 MED ORDER — IOHEXOL 180 MG/ML  SOLN
INTRAMUSCULAR | Status: DC | PRN
  Administered 2024-02-15: 20 mL

## 2024-02-15 MED ORDER — ACETAMINOPHEN 10 MG/ML IV SOLN: 1000.0000 mg | Freq: Once | INTRAVENOUS | Status: DC | PRN

## 2024-02-15 MED ORDER — CELECOXIB 200 MG PO CAPS
ORAL_CAPSULE | ORAL | Status: AC
  Filled 2024-02-15: qty 1

## 2024-02-15 MED ORDER — STERILE WATER FOR IRRIGATION IR SOLN
Status: DC | PRN
  Administered 2024-02-15: 500 mL

## 2024-02-15 MED ORDER — FENTANYL CITRATE (PF) 100 MCG/2ML IJ SOLN
INTRAMUSCULAR | Status: DC | PRN
  Administered 2024-02-15: 50 ug via INTRAVENOUS

## 2024-02-15 MED ORDER — LIDOCAINE HCL (PF) 2 % IJ SOLN
INTRAMUSCULAR | Status: AC
  Filled 2024-02-15: qty 5

## 2024-02-15 MED ORDER — PHENYLEPHRINE 80 MCG/ML (10ML) SYRINGE FOR IV PUSH (FOR BLOOD PRESSURE SUPPORT)
PREFILLED_SYRINGE | INTRAVENOUS | Status: AC
  Filled 2024-02-15: qty 20

## 2024-02-15 MED ORDER — ONDANSETRON HCL 4 MG/2ML IJ SOLN
INTRAMUSCULAR | Status: DC | PRN
  Administered 2024-02-15: 4 mg via INTRAVENOUS

## 2024-02-15 MED ORDER — SODIUM CHLORIDE 0.9 % IR SOLN
Status: DC | PRN
  Administered 2024-02-15: 3000 mL

## 2024-02-15 SURGICAL SUPPLY — 21 items
BAG DRAIN SIEMENS DORNER NS (MISCELLANEOUS) ×1 IMPLANT
BASKET ZERO TIP 1.9FR (BASKET) IMPLANT
BRUSH SCRUB EZ 4% CHG (MISCELLANEOUS) ×1 IMPLANT
CATH URET FLEX-TIP 2 LUMEN 10F (CATHETERS) IMPLANT
CATH URETL OPEN END 6X70 (CATHETERS) IMPLANT
DRAPE UTILITY 15X26 TOWEL STRL (DRAPES) ×1 IMPLANT
FIBER LASER MOSES 200 DFL (Laser) IMPLANT
GLOVE BIOGEL PI IND STRL 7.5 (GLOVE) ×1 IMPLANT
GOWN STRL REUS W/ TWL LRG LVL3 (GOWN DISPOSABLE) ×1 IMPLANT
GOWN STRL REUS W/ TWL XL LVL3 (GOWN DISPOSABLE) ×1 IMPLANT
GUIDEWIRE STR DUAL SENSOR (WIRE) ×1 IMPLANT
KIT TURNOVER CYSTO (KITS) ×1 IMPLANT
PACK CYSTO AR (MISCELLANEOUS) ×1 IMPLANT
SHEATH NAVIGATOR HD 12/14X36 (SHEATH) IMPLANT
SOL .9 NS 3000ML IRR UROMATIC (IV SOLUTION) ×1 IMPLANT
SOLN STERILE WATER 500 ML (IV SOLUTION) ×1 IMPLANT
STENT URET 6FRX24 CONTOUR (STENTS) IMPLANT
STENT URET 6FRX26 CONTOUR (STENTS) IMPLANT
SURGILUBE 2OZ TUBE FLIPTOP (MISCELLANEOUS) ×1 IMPLANT
TUBING THERMACLEAR UROLOGY (TUBING) ×1 IMPLANT
VALVE UROSEAL ADJ ENDO (VALVE) IMPLANT

## 2024-02-15 NOTE — Anesthesia Postprocedure Evaluation (Signed)
"   Anesthesia Post Note  Patient: Reiley Keisler Mandrell  Procedure(s) Performed: CYSTOSCOPY/URETEROSCOPY/HOLMIUM LASER/STENT PLACEMENT (Left: Ureter)  Patient location during evaluation: PACU Anesthesia Type: General Level of consciousness: awake and alert Pain management: pain level controlled Vital Signs Assessment: post-procedure vital signs reviewed and stable Respiratory status: spontaneous breathing, nonlabored ventilation and respiratory function stable Cardiovascular status: blood pressure returned to baseline and stable Postop Assessment: no apparent nausea or vomiting Anesthetic complications: no   No notable events documented.   Last Vitals:  Vitals:   02/15/24 1050 02/15/24 1055  BP:  128/88  Pulse: 87 74  Resp: 15 16  Temp:  (!) 36.4 C  SpO2: 94% 98%    Last Pain:  Vitals:   02/15/24 1055  TempSrc:   PainSc: 5                  Camellia Merilee Louder      "

## 2024-02-15 NOTE — Anesthesia Procedure Notes (Signed)
 Procedure Name: LMA Insertion Date/Time: 02/15/2024 9:00 AM  Performed by: Latamara Melder, CRNAPre-anesthesia Checklist: Patient identified, Emergency Drugs available, Suction available and Patient being monitored Patient Re-evaluated:Patient Re-evaluated prior to induction Oxygen Delivery Method: Circle System Utilized Preoxygenation: Pre-oxygenation with 100% oxygen Induction Type: IV induction Ventilation: Mask ventilation without difficulty LMA: LMA inserted LMA Size: 4.0 Number of attempts: 1 Airway Equipment and Method: Bite block Placement Confirmation: positive ETCO2 Tube secured with: Tape Dental Injury: Teeth and Oropharynx as per pre-operative assessment  Comments: Lips teeth and tongue unchanged. Head and neck midline.

## 2024-02-15 NOTE — Op Note (Signed)
 "  Preoperative diagnosis:  Left proximal ureteral calculus Left nephrolithiasis  Postoperative diagnosis:  Same  Procedure:  Cystoscopy Left ureteroscopy and stone removal Ureteroscopic laser lithotripsy Left ureteral stent placement (49F/26 cm)  Left retrograde pyelography with interpretation  Surgeon: Glendia C. Alisa Stjames, M.D.  Anesthesia: General  Complications: None  Intraoperative findings:  Cystoscopy: Urethra normal in caliber without stricture.  Prostate nonocclusive with moderate bladder neck elevation.  Bladder mucosa without solid or papillary lesions.  Left Hutch diverticulum. Ureteropyeloscopy: Non-impacted stone left proximal ureter.  Moderate hydronephrosis with caliectasis Left retrograde pyelography demonstrated a filling defect within the proximal ureter consistent with the patients known calculus without other abnormalities.  Moderate hydronephrosis Left retrograde pyelography post procedure showed no filling defects, stone fragments or contrast extravasation  EBL: Minimal  Specimens: Calculus fragments for analysis   Indication: Luke Campbell is a 34 y.o. patient with a 7 mm left proximal ureteral calculus who presents for ureteroscopic stone removal.  Refer to the admission H&P for details.  After reviewing the management options for treatment, the patient elected to proceed with the above surgical procedure(s). We have discussed the potential benefits and risks of the procedure, side effects of the proposed treatment, the likelihood of the patient achieving the goals of the procedure, and any potential problems that might occur during the procedure or recuperation. Informed consent has been obtained.  Description of procedure:  The patient was taken to the operating room and general anesthesia was induced.  The patient was placed in the dorsal lithotomy position, prepped and draped in the usual sterile fashion, and preoperative antibiotics were  administered. A preoperative time-out was performed.   A 21 Fr cystoscope was lubricated, placed per urethra and advanced proximally into the bladder under direct vision.  Panendoscopy was performed with findings described above.  The left ureteral orifice was identified just at the rim of the Hutch diverticulum around the 7 o'clock position  Attention was directed to the left ureteral orifice and a 0.038 Sensor wire was then advanced up the  ureter into the renal pelvis under fluoroscopic guidance.  The cystoscope was removed and a dual-lumen catheter was placed over the sensor wire.  Retrograde pyelogram was performed with findings as described above.  A second sensor wire was placed in a similar fashion.  A single channel digital flexible ureteroscope was advanced over the working wire and into the ureter under fluoroscopic guidance.  Once in the proximal ureter the working wire was removed and the calculus was identified.  A 200 m Moses holmium laser fiber was placed through the ureteroscope and the stone was dusted at a setting of 0.3 J/80 hz.  Retropulsion of the calculus into a lower pole calyx occurred during dusting.  The calculus was further treated in the calyx at a setting of 0.5 J/80 Hz additional stone fragments were identified and adjacent calyx which were further treated with noncontact laser lithotripsy until there were no fragments identified larger than 1 mm in size.  2 small fragments in the lower pole calyx were placed in a 1.9 F nitinol basket and removed without difficulty.  The fragments were sent for analysis.  Prior to removal of the ureteroscope with these fragments all calyces were examined and retrograde pyelogram was performed through the ureteroscope with findings as described above.  No additional significant size stone fragments were identified.  A 6 F/26 cm Contour ureteral stent was placed under fluoroscopic guidance.  The wire was then removed with an adequate stent  curl noted in the renal pelvis as well as in the bladder.  The bladder was then emptied and the procedure ended.  The patient appeared to tolerate the procedure well and without complications.  After anesthetic reversal the patient was transported to the PACU in stable condition.    Plan: Office stent removal will be scheduled in 7-10 days   Glendia Barba, MD  "

## 2024-02-15 NOTE — Discharge Instructions (Signed)
 DISCHARGE INSTRUCTIONS FOR KIDNEY STONE/URETERAL STENT   MEDICATIONS:  1. Resume all your other meds from home.  2.  AZO (over-the-counter) can help with the burning/stinging when you urinate. 3.  Hydrocodone is for moderate/severe pain, Rx was sent to your pharmacy. 4.  A prescription for oxybutynin which will help with bladder irritation was sent to your pharmacy.  Would continue tamsulosin  which will also help with bladder irritation  ACTIVITY:  1. May resume regular activities in 24 hours. 2. No driving while on narcotic pain medications  3. Drink plenty of water  4. Continue to walk at home - you can still get blood clots when you are at home, so keep active, but don't over do it.  5. May return to work/school tomorrow or when you feel ready    SIGNS/SYMPTOMS TO CALL:  Common postoperative symptoms include urinary frequency, urgency, bladder spasm and blood in the urine  Please call us  if you have a fever greater than 101.5, uncontrolled nausea/vomiting, uncontrolled pain, dizziness, unable to urinate, excessively bloody urine, chest pain, shortness of breath, leg swelling, leg pain, or any other concerns or questions.   You can reach us  at [phone].   FOLLOW-UP:  1.  Our office will contact you for an appointment for stent removal to be done in 7-10 days

## 2024-02-15 NOTE — Interval H&P Note (Signed)
 History and Physical Interval Note:  02/15/2024 8:50 AM  Luke Campbell  has presented today for surgery, with the diagnosis of Left Ureteral Stone.  The various methods of treatment have been discussed with the patient and family. After consideration of risks, benefits and other options for treatment, the patient has consented to  Procedures: CYSTOSCOPY/URETEROSCOPY/HOLMIUM LASER/STENT PLACEMENT (Left) as a surgical intervention.  The patient's history has been reviewed, patient examined, no change in status, stable for surgery.  I have reviewed the patient's chart and labs.  Questions were answered to the patient's satisfaction.    CV:RRR Lungs:clear  Glendia JAYSON Barba

## 2024-02-15 NOTE — Transfer of Care (Signed)
 Immediate Anesthesia Transfer of Care Note  Patient: Luke Campbell  Procedure(s) Performed: CYSTOSCOPY/URETEROSCOPY/HOLMIUM LASER/STENT PLACEMENT (Left: Ureter)  Patient Location: PACU  Anesthesia Type:General  Level of Consciousness: drowsy  Airway & Oxygen Therapy: Patient Spontanous Breathing and Patient connected to face mask oxygen  Post-op Assessment: Report given to RN and Post -op Vital signs reviewed and stable  Post vital signs: Reviewed and stable  Last Vitals:  Vitals Value Taken Time  BP 123/84 02/15/24 09:57  Temp 36.3 C 02/15/24 09:57  Pulse 84 02/15/24 09:59  Resp 14 02/15/24 09:59  SpO2 97 % 02/15/24 09:59  Vitals shown include unfiled device data.  Last Pain:  Vitals:   02/15/24 0727  TempSrc: Temporal  PainSc: 0-No pain         Complications: No notable events documented.

## 2024-02-16 ENCOUNTER — Encounter: Payer: Self-pay | Admitting: Urology

## 2024-02-18 ENCOUNTER — Other Ambulatory Visit: Payer: Self-pay | Admitting: Urology

## 2024-02-20 ENCOUNTER — Telehealth: Payer: Self-pay

## 2024-02-20 NOTE — Telephone Encounter (Signed)
 Pt LM on triage line-   S/p Ureteroscopy with stent placement on 02/15/24 by [facility] for kidney stone.   Since surgery he is been burning with urination, urgency, and a small amount of blood in the urine.   Soreness on the let side. No fever.  Pt is at work today.   Advised pt these are all normal sx with a stent in place. Monitor for now. Keep stent removal appt.

## 2024-02-20 NOTE — Telephone Encounter (Signed)
 Talked with patient about the blood and burning. He has a stent in . Advised patient its okay to have and see that . He understands, Scheduled for removal on 02/26/2024

## 2024-02-23 ENCOUNTER — Other Ambulatory Visit: Payer: Self-pay | Admitting: Urology

## 2024-02-25 LAB — STONE ANALYSIS
Calcium Oxalate Dihydrate: 30 %
Calcium Oxalate Monohydrate: 70 %
Weight Calculi: 16 mg

## 2024-02-26 ENCOUNTER — Ambulatory Visit: Payer: Self-pay | Admitting: Urology

## 2024-02-26 VITALS — BP 129/92 | HR 76 | Ht 69.0 in | Wt 160.0 lb

## 2024-02-26 DIAGNOSIS — N201 Calculus of ureter: Secondary | ICD-10-CM

## 2024-02-26 DIAGNOSIS — Z466 Encounter for fitting and adjustment of urinary device: Secondary | ICD-10-CM

## 2024-02-26 LAB — URINALYSIS, COMPLETE
Bilirubin, UA: NEGATIVE
Glucose, UA: NEGATIVE
Ketones, UA: NEGATIVE
Nitrite, UA: NEGATIVE
Specific Gravity, UA: 1.025 (ref 1.005–1.030)
Urobilinogen, Ur: 0.2 mg/dL (ref 0.2–1.0)
pH, UA: 6 (ref 5.0–7.5)

## 2024-02-26 LAB — MICROSCOPIC EXAMINATION: RBC, Urine: >30 /HPF — AB (ref 0–2)

## 2024-02-26 MED ORDER — SULFAMETHOXAZOLE-TRIMETHOPRIM 800-160 MG PO TABS
1.0000 | ORAL_TABLET | Freq: Once | ORAL | Status: AC
  Administered 2024-02-26: 1 via ORAL

## 2024-02-26 NOTE — Progress Notes (Unsigned)
" ° °  Indications: Patient is 34 y.o., who is s/p ureteroscopic removal of a left proximal ureteral calculus and small left renal calculus 02/15/2024.  The patient is presenting today for stent removal.  He had no postoperative problems.  Mild stent symptoms  Procedure:  Flexible Cystoscopy with stent removal (47689)  Timeout was performed and the correct patient, procedure and participants were identified.    Description:  The patient was prepped and draped in the usual sterile fashion. Flexible cystosopy was performed.  The stent was visualized, grasped, and removed intact without difficulty. The patient tolerated the procedure well.  A single dose of oral antibiotics was given.  Complications:  None  Plan:  Call for fever/flank pain post stent removal Stone analysis CaOxMono/CaOxDi: 70/30 Discussed metabolic evaluation for recurrent stone disease and he would like to pursue   Glendia Barba, MD  "

## 2024-02-26 NOTE — Patient Instructions (Signed)

## 2024-02-29 ENCOUNTER — Ambulatory Visit: Payer: Self-pay | Admitting: Urology
